# Patient Record
Sex: Male | Born: 1980 | Race: White | Hispanic: No | Marital: Single | Smoking: Current every day smoker
Health system: Southern US, Academic
[De-identification: ages and names within clinical notes are randomized; demographics above are authoritative.]

## PROBLEM LIST (undated history)

## (undated) DIAGNOSIS — I10 Essential (primary) hypertension: Secondary | ICD-10-CM

## (undated) DIAGNOSIS — M1990 Unspecified osteoarthritis, unspecified site: Secondary | ICD-10-CM

## (undated) DIAGNOSIS — J449 Chronic obstructive pulmonary disease, unspecified: Secondary | ICD-10-CM

## (undated) DIAGNOSIS — E7800 Pure hypercholesterolemia, unspecified: Secondary | ICD-10-CM

## (undated) DIAGNOSIS — M109 Gout, unspecified: Secondary | ICD-10-CM

## (undated) HISTORY — DX: Unspecified osteoarthritis, unspecified site: M19.90

## (undated) HISTORY — DX: Chronic obstructive pulmonary disease, unspecified (CMS HCC): J44.9

## (undated) HISTORY — DX: Gout, unspecified: M10.9

## (undated) HISTORY — DX: Pure hypercholesterolemia, unspecified: E78.00

## (undated) HISTORY — PX: HX NO SURGICAL PROCEDURES: 2100001501

## (undated) HISTORY — DX: Essential (primary) hypertension: I10

---

## 2021-04-15 ENCOUNTER — Emergency Department (HOSPITAL_COMMUNITY): Payer: Self-pay | Admitting: Emergency Medicine

## 2022-07-02 ENCOUNTER — Other Ambulatory Visit (HOSPITAL_BASED_OUTPATIENT_CLINIC_OR_DEPARTMENT_OTHER): Payer: BLUE CROSS/BLUE SHIELD

## 2022-07-02 ENCOUNTER — Other Ambulatory Visit (HOSPITAL_COMMUNITY): Payer: Self-pay | Admitting: PSYCHIATRY AND NEUROLOGY-NEUROLOGY

## 2022-07-02 ENCOUNTER — Other Ambulatory Visit: Payer: Self-pay

## 2022-07-02 ENCOUNTER — Inpatient Hospital Stay
Admission: RE | Admit: 2022-07-02 | Discharge: 2022-07-02 | Disposition: A | Discharge status: Home / Self Care | Payer: BLUE CROSS/BLUE SHIELD | Source: Ambulatory Visit | Attending: PSYCHIATRY AND NEUROLOGY-NEUROLOGY | Admitting: PSYCHIATRY AND NEUROLOGY-NEUROLOGY

## 2022-07-02 DIAGNOSIS — M25561 Pain in right knee: Secondary | ICD-10-CM | POA: Insufficient documentation

## 2022-07-03 LAB — URIC ACID: URIC ACID: 5.9 mg/dL (ref 2.3–7.6)

## 2022-08-18 ENCOUNTER — Inpatient Hospital Stay
Admission: RE | Admit: 2022-08-18 | Discharge: 2022-08-18 | Disposition: A | Discharge status: Home / Self Care | Payer: BLUE CROSS/BLUE SHIELD | Source: Ambulatory Visit | Attending: NURSE PRACTITIONER | Admitting: NURSE PRACTITIONER

## 2022-08-18 ENCOUNTER — Other Ambulatory Visit (HOSPITAL_COMMUNITY): Payer: Self-pay | Admitting: NURSE PRACTITIONER

## 2022-08-18 ENCOUNTER — Other Ambulatory Visit: Payer: Self-pay

## 2022-08-18 DIAGNOSIS — F17290 Nicotine dependence, other tobacco product, uncomplicated: Secondary | ICD-10-CM

## 2022-08-18 DIAGNOSIS — M25561 Pain in right knee: Secondary | ICD-10-CM

## 2022-08-18 DIAGNOSIS — M25562 Pain in left knee: Secondary | ICD-10-CM | POA: Insufficient documentation

## 2022-08-31 ENCOUNTER — Encounter (HOSPITAL_COMMUNITY): Payer: Self-pay

## 2022-08-31 ENCOUNTER — Ambulatory Visit (HOSPITAL_COMMUNITY): Payer: Self-pay

## 2022-08-31 ENCOUNTER — Other Ambulatory Visit (HOSPITAL_COMMUNITY): Payer: Self-pay | Admitting: PULMONARY DISEASE

## 2022-08-31 DIAGNOSIS — R0602 Shortness of breath: Secondary | ICD-10-CM

## 2022-09-08 ENCOUNTER — Other Ambulatory Visit: Payer: Self-pay

## 2022-09-08 ENCOUNTER — Inpatient Hospital Stay
Admission: RE | Admit: 2022-09-08 | Discharge: 2022-09-08 | Disposition: A | Discharge status: Home / Self Care | Payer: BLUE CROSS/BLUE SHIELD | Source: Ambulatory Visit | Attending: PULMONARY DISEASE | Admitting: PULMONARY DISEASE

## 2022-09-08 DIAGNOSIS — R0602 Shortness of breath: Secondary | ICD-10-CM | POA: Insufficient documentation

## 2022-09-08 MED ORDER — SODIUM CHLORIDE 0.9 % INJECTION SOLUTION
2.0000 mL | Freq: Once | INTRAVENOUS | Status: DC | PRN
Start: 2022-09-08 — End: 2022-09-09
  Administered 2022-09-08: 5 mL via INTRAVENOUS

## 2022-09-11 DIAGNOSIS — R0602 Shortness of breath: Secondary | ICD-10-CM

## 2022-10-29 ENCOUNTER — Inpatient Hospital Stay
Admission: RE | Admit: 2022-10-29 | Discharge: 2022-10-29 | Disposition: A | Discharge status: Home / Self Care | Payer: Medicaid Other | Source: Ambulatory Visit | Attending: NURSE PRACTITIONER | Admitting: NURSE PRACTITIONER

## 2022-10-29 ENCOUNTER — Other Ambulatory Visit (HOSPITAL_COMMUNITY): Payer: Self-pay | Admitting: NURSE PRACTITIONER

## 2022-10-29 ENCOUNTER — Inpatient Hospital Stay (HOSPITAL_BASED_OUTPATIENT_CLINIC_OR_DEPARTMENT_OTHER)
Admission: RE | Admit: 2022-10-29 | Discharge: 2022-10-29 | Disposition: A | Discharge status: Home / Self Care | Payer: Medicaid Other | Source: Ambulatory Visit

## 2022-10-29 ENCOUNTER — Other Ambulatory Visit: Payer: Self-pay

## 2022-10-29 DIAGNOSIS — M545 Low back pain, unspecified: Secondary | ICD-10-CM

## 2023-05-01 ENCOUNTER — Emergency Department (HOSPITAL_BASED_OUTPATIENT_CLINIC_OR_DEPARTMENT_OTHER): Payer: MEDICAID

## 2023-05-01 ENCOUNTER — Encounter (HOSPITAL_BASED_OUTPATIENT_CLINIC_OR_DEPARTMENT_OTHER): Payer: Self-pay

## 2023-05-01 ENCOUNTER — Other Ambulatory Visit: Payer: Self-pay

## 2023-05-01 ENCOUNTER — Emergency Department
Admission: EM | Admit: 2023-05-01 | Discharge: 2023-05-01 | Disposition: A | Discharge status: Home / Self Care | Payer: MEDICAID | Attending: Family Medicine | Admitting: Family Medicine

## 2023-05-01 DIAGNOSIS — W19XXXA Unspecified fall, initial encounter: Secondary | ICD-10-CM | POA: Insufficient documentation

## 2023-05-01 DIAGNOSIS — S82121A Displaced fracture of lateral condyle of right tibia, initial encounter for closed fracture: Secondary | ICD-10-CM

## 2023-05-01 DIAGNOSIS — S82144A Nondisplaced bicondylar fracture of right tibia, initial encounter for closed fracture: Secondary | ICD-10-CM | POA: Insufficient documentation

## 2023-05-01 DIAGNOSIS — R262 Difficulty in walking, not elsewhere classified: Secondary | ICD-10-CM | POA: Insufficient documentation

## 2023-05-01 DIAGNOSIS — W1839XA Other fall on same level, initial encounter: Secondary | ICD-10-CM

## 2023-05-01 DIAGNOSIS — Y9302 Activity, running: Secondary | ICD-10-CM | POA: Insufficient documentation

## 2023-05-01 DIAGNOSIS — X501XXA Overexertion from prolonged static or awkward postures, initial encounter: Secondary | ICD-10-CM | POA: Insufficient documentation

## 2023-05-01 MED ORDER — KETOROLAC 30 MG/ML (1 ML) INJECTION SOLUTION
INTRAMUSCULAR | Status: AC
Start: 2023-05-01 — End: 2023-05-01
  Filled 2023-05-01: qty 1

## 2023-05-01 MED ORDER — METHYLPREDNISOLONE SOD SUCC 125 MG SOLUTION FOR INJECTION WRAPPER
INTRAVENOUS | Status: AC
Start: 2023-05-01 — End: 2023-05-01
  Filled 2023-05-01: qty 2

## 2023-05-01 MED ORDER — METHYLPREDNISOLONE SOD SUCC 125 MG SOLUTION FOR INJECTION WRAPPER
62.5000 mg | INTRAVENOUS | Status: AC
Start: 2023-05-01 — End: 2023-05-01
  Administered 2023-05-01: 62.5 mg via INTRAMUSCULAR

## 2023-05-01 MED ORDER — HYDROCODONE 5 MG-ACETAMINOPHEN 325 MG TABLET
ORAL_TABLET | ORAL | Status: AC
Start: 2023-05-01 — End: 2023-05-01
  Filled 2023-05-01: qty 1

## 2023-05-01 MED ORDER — KETOROLAC 30 MG/ML (1 ML) INJECTION SOLUTION
30.0000 mg | INTRAMUSCULAR | Status: AC
Start: 2023-05-01 — End: 2023-05-01
  Administered 2023-05-01: 30 mg via INTRAMUSCULAR

## 2023-05-01 MED ORDER — HYDROCODONE 5 MG-ACETAMINOPHEN 325 MG TABLET
1.0000 | ORAL_TABLET | ORAL | Status: AC
Start: 2023-05-01 — End: 2023-05-01
  Administered 2023-05-01: 1 via ORAL

## 2023-05-01 MED ORDER — HYDROCODONE 5 MG-ACETAMINOPHEN 325 MG TABLET
1.0000 | ORAL_TABLET | Freq: Four times a day (QID) | ORAL | 0 refills | Status: DC | PRN
Start: 2023-05-01 — End: 2023-07-28
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
## 2023-05-01 NOTE — ED Nurses Note (Signed)
Patient given written and verbal d/c instructions. All questions/concerns addressed. Informed to return with any concerns. Informed to follow up with orthopedic as soon as possible. Crutches and crutch training demonstrated. Patient leaving via wheelchair at this time.

## 2023-05-01 NOTE — ED Nurses Note (Signed)
Patient continues to report pain to right knee. Awaiting CT.

## 2023-05-01 NOTE — ED Triage Notes (Signed)
Twisted rt knee getting away from a bee yesteday, now pain with movement, hard to bear weight.
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
## 2023-05-24 IMAGING — MR MRI KNEE RT W/O CONTRAST
5 series · 40 of 40 positions shown · IV contrast (gadolinium)
Comparison: None available.

﻿EXAM:  [id number]   MRI KNEE RT W/O CONTRAST
INDICATION: 41-year-old with history of possible lateral tibial plateau fracture.  Trauma due to fall and running 3 weeks ago.  No prior knee surgery.
TECHNIQUE: Multiplanar, multisequential MRI of the right knee was performed without gadolinium contrast.

[Series 5: PD fat-sat · axial · right · 4.0mm · 0.53mm/px · z∈[-55,+76]mm · 8 of 30 slices shown (1 of 3)]
[im 1/30]
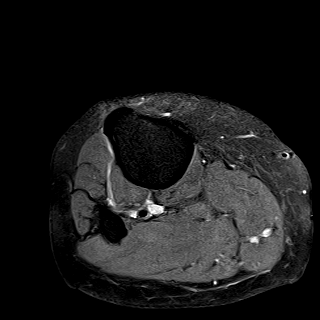
[im 5/30]
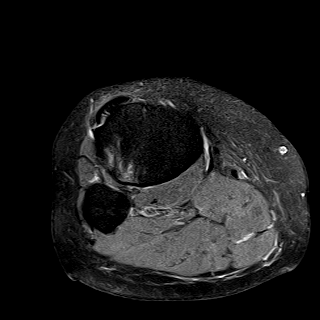
[im 9/30]
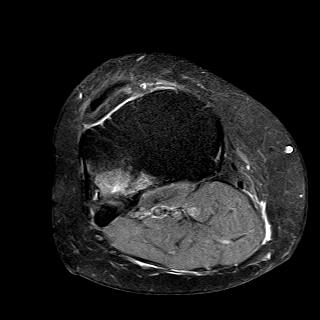
[im 13/30]
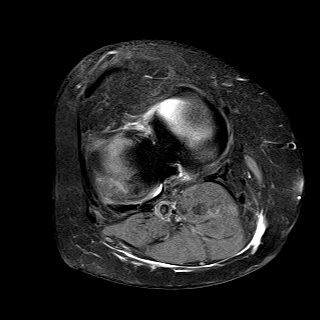
[im 17/30]
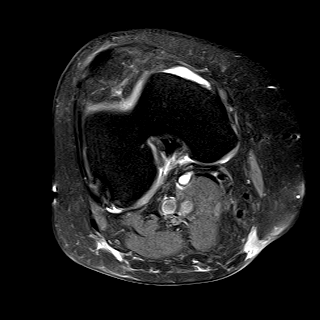
[im 21/30]
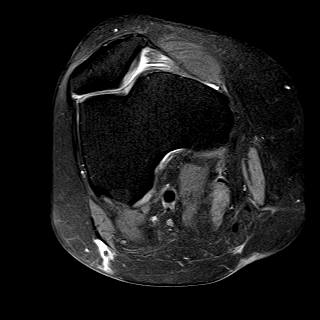
[im 25/30]
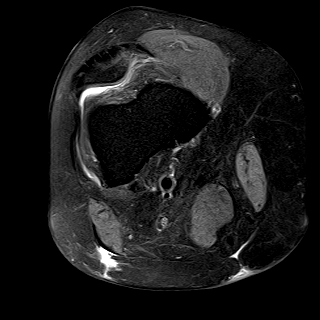
[im 30/30]
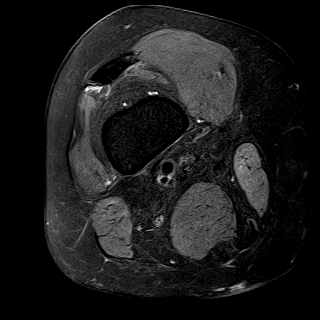

[Series 6: PD fat-sat · sagittal · right · 3.0mm · 0.53mm/px · 9 of 30 slices shown (2 of 3)]
[im 1/30]
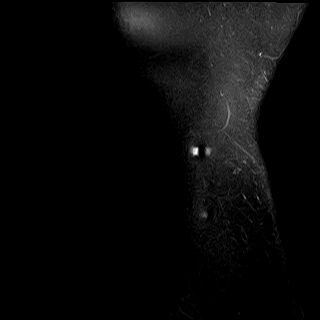
[im 4/30]
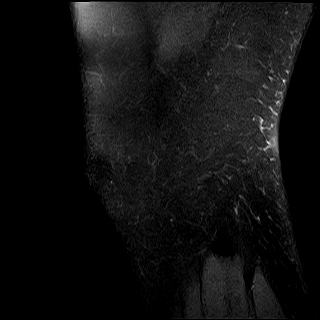
[im 8/30]
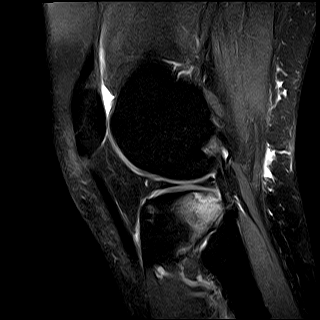
[im 11/30]
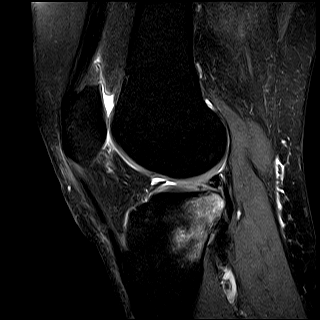
[im 15/30]
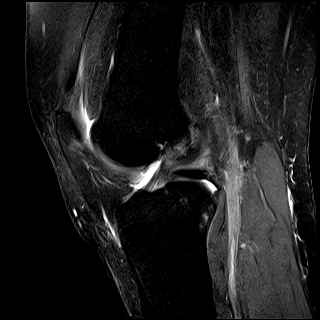
[im 19/30]
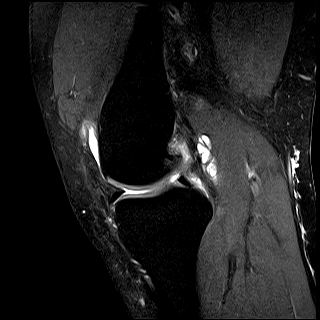
[im 22/30]
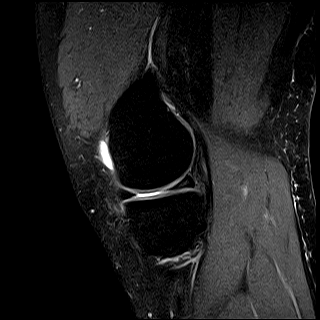
[im 26/30]
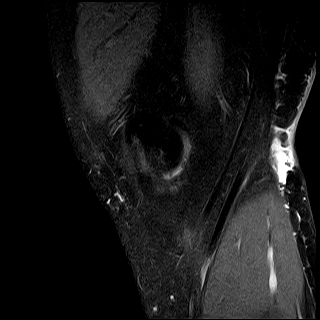
[im 30/30]
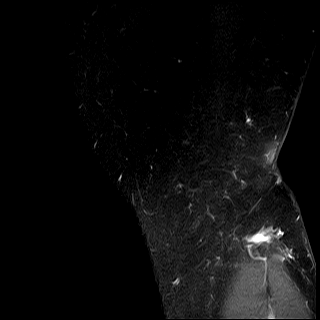

[Series 7: T1 · sagittal · right · 3.0mm · 0.44mm/px · 9 of 30 slices shown]
[im 1/30]
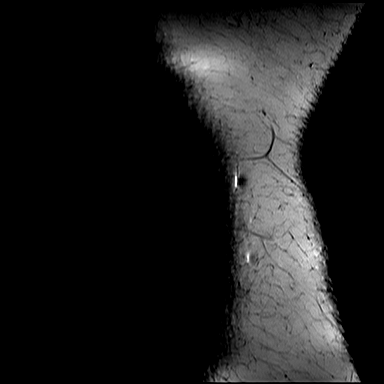
[im 4/30]
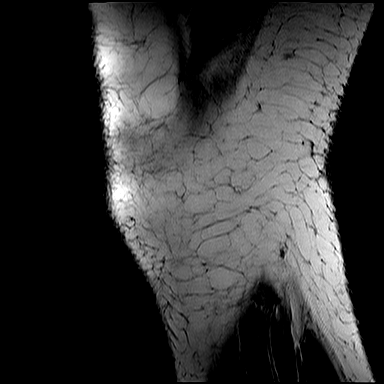
[im 8/30]
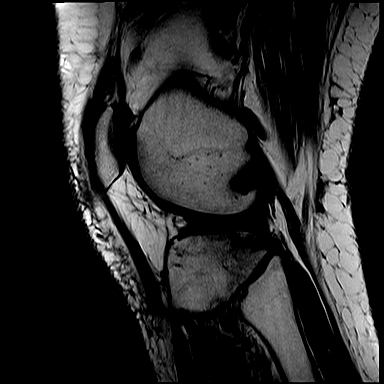
[im 11/30]
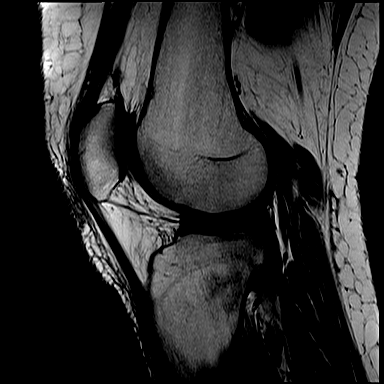
[im 15/30]
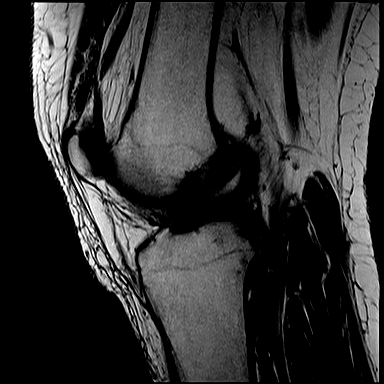
[im 19/30]
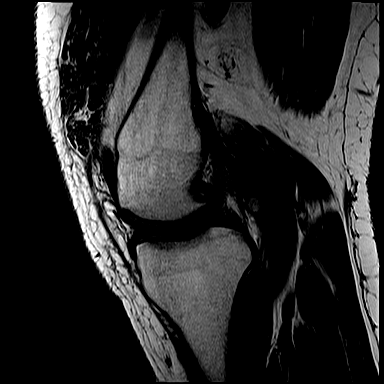
[im 22/30]
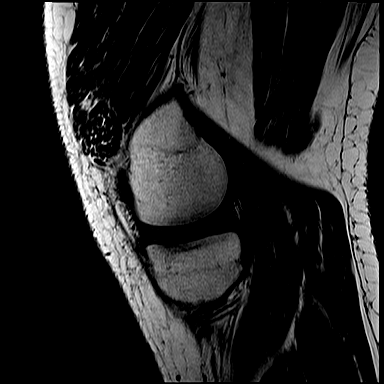
[im 26/30]
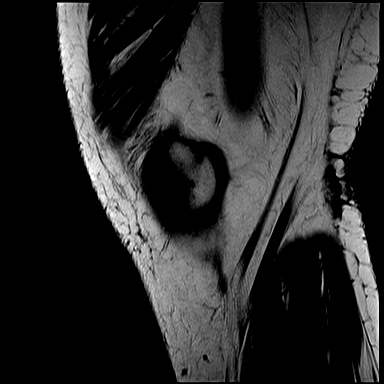
[im 30/30]
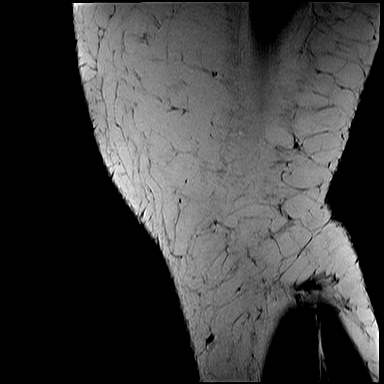

[Series 8: STIR · coronal · right · 3.5mm · 0.53mm/px · 7 of 25 slices shown]
[im 1/25]
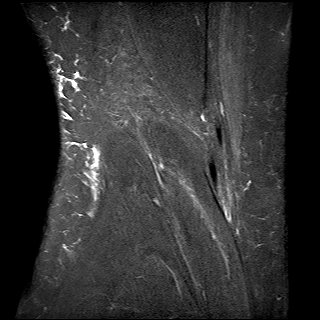
[im 5/25]
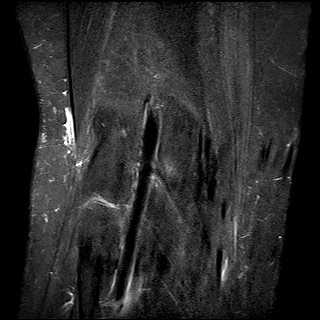
[im 9/25]
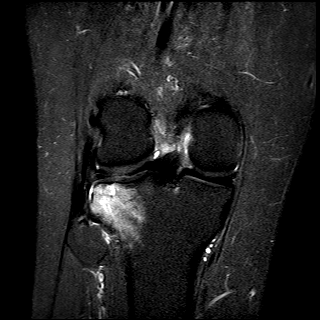
[im 13/25]
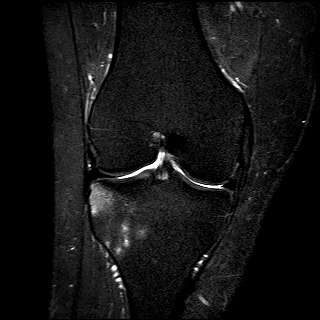
[im 17/25]
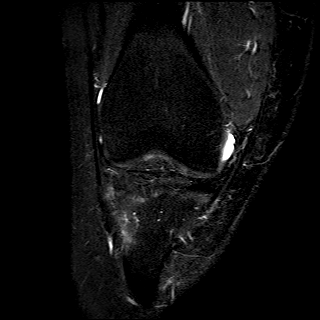
[im 21/25]
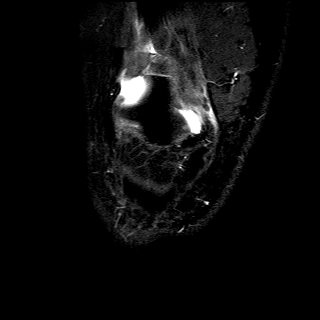
[im 25/25]
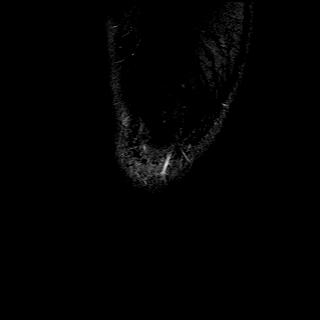

[Series 9: PD fat-sat · coronal · right · 3.5mm · 0.53mm/px · 7 of 25 slices shown (3 of 3)]
[im 1/25]
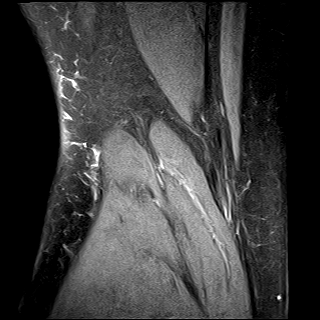
[im 5/25]
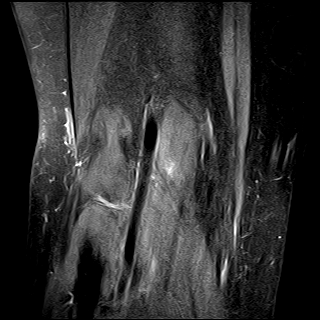
[im 9/25]
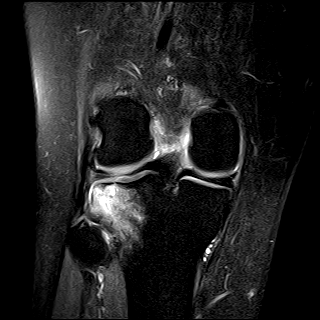
[im 13/25]
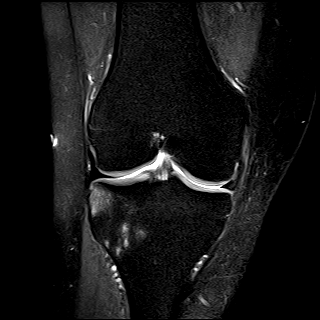
[im 17/25]
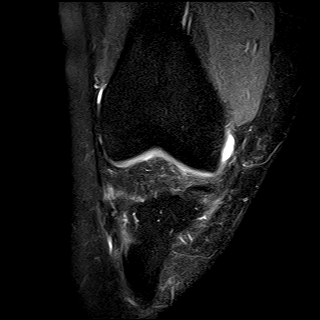
[im 21/25]
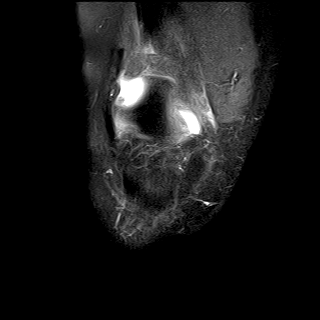
[im 25/25]
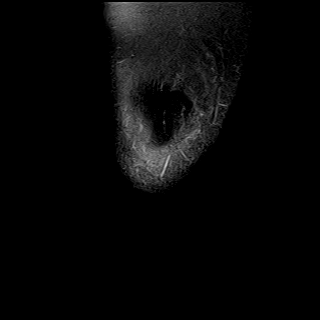

[40 of 40 positions shown; findings below may reference images not displayed]

FINDINGS: Acute, nondisplaced fracture of the posterior corner of lateral tibial plateau is noted with surrounding bone marrow edema.  There is no disruption of the overlying articular cartilage.

The lateral meniscus shows horizontally oriented, nondisplaced tear of the mid and posterior horn.

The anterior and posterior cruciate ligaments are intact.  Medial meniscus shows no acute findings.  The collateral ligaments are intact.  Quadriceps tendon and patellar tendon are intact.  Small effusion in the knee joint.
IMPRESSION: 1. Acute, nondisplaced fracture of the posterior corner of lateral tibial plateau with surrounding bone marrow edema. Articular cartilage over the fracture area is intact.

2. Nondisplaced, horizontally oriented tear of the mid and posterior horn of the lateral meniscus.

## 2023-06-30 ENCOUNTER — Inpatient Hospital Stay (HOSPITAL_BASED_OUTPATIENT_CLINIC_OR_DEPARTMENT_OTHER)
Admission: RE | Admit: 2023-06-30 | Discharge: 2023-06-30 | Disposition: A | Discharge status: Home / Self Care | Payer: MEDICAID | Source: Ambulatory Visit | Attending: Orthopaedic Surgery

## 2023-06-30 ENCOUNTER — Other Ambulatory Visit: Payer: Self-pay

## 2023-06-30 ENCOUNTER — Other Ambulatory Visit: Payer: MEDICAID | Attending: Orthopaedic Surgery

## 2023-06-30 ENCOUNTER — Other Ambulatory Visit (HOSPITAL_COMMUNITY): Payer: Self-pay | Admitting: Orthopaedic Surgery

## 2023-06-30 DIAGNOSIS — Z01818 Encounter for other preprocedural examination: Secondary | ICD-10-CM | POA: Insufficient documentation

## 2023-06-30 DIAGNOSIS — R001 Bradycardia, unspecified: Secondary | ICD-10-CM

## 2023-06-30 LAB — ECG 12 LEAD
Atrial Rate: 57 {beats}/min
Calculated P Axis: 28 degrees
Calculated R Axis: 35 degrees
Calculated T Axis: 4 degrees
PR Interval: 140 ms
QRS Duration: 92 ms
QT Interval: 396 ms
QTC Calculation: 385 ms
Ventricular rate: 57 {beats}/min

## 2023-06-30 LAB — URINALYSIS, MACROSCOPIC
BILIRUBIN: NEGATIVE mg/dL
BLOOD: NEGATIVE mg/dL
GLUCOSE: NEGATIVE mg/dL
KETONES: NEGATIVE mg/dL
LEUKOCYTES: NEGATIVE WBCs/uL
NITRITE: NEGATIVE
PH: 7 (ref 5.0–9.0)
PROTEIN: NEGATIVE mg/dL
SPECIFIC GRAVITY: 1.014 (ref 1.002–1.030)
UROBILINOGEN: NORMAL mg/dL

## 2023-06-30 LAB — CBC
HCT: 43.1 % (ref 36.7–47.1)
HGB: 14.7 g/dL (ref 12.5–16.3)
MCH: 28.6 pg (ref 23.8–33.4)
MCHC: 34.1 g/dL (ref 32.5–36.3)
MCV: 83.9 fL (ref 73.0–96.2)
MPV: 8 fL (ref 7.4–11.4)
PLATELETS: 338 10*3/uL (ref 140–440)
RBC: 5.14 10*6/uL (ref 4.06–5.63)
RDW: 15 % (ref 12.1–16.2)
WBC: 5.9 10*3/uL (ref 3.6–10.2)

## 2023-06-30 LAB — BASIC METABOLIC PANEL
ANION GAP: 7 mmol/L (ref 4–13)
BUN/CREA RATIO: 13 (ref 6–22)
BUN: 14 mg/dL (ref 7–25)
CALCIUM: 9.6 mg/dL (ref 8.6–10.3)
CHLORIDE: 108 mmol/L — ABNORMAL HIGH (ref 98–107)
CO2 TOTAL: 25 mmol/L (ref 21–31)
CREATININE: 1.08 mg/dL (ref 0.60–1.30)
ESTIMATED GFR: 88 mL/min/{1.73_m2} (ref >59)
GLUCOSE: 103 mg/dL (ref 74–109)
OSMOLALITY, CALCULATED: 280 mOsm/kg (ref 270–290)
POTASSIUM: 4.1 mmol/L (ref 3.5–5.1)
SODIUM: 140 mmol/L (ref 136–145)

## 2023-06-30 LAB — URINALYSIS, MICROSCOPIC
RBCS: <1 /hpf (ref <4)
WBCS: <1 /hpf (ref <6)

## 2023-07-28 ENCOUNTER — Other Ambulatory Visit: Payer: Self-pay

## 2023-07-28 ENCOUNTER — Inpatient Hospital Stay
Admission: RE | Admit: 2023-07-28 | Discharge: 2023-07-28 | Disposition: A | Discharge status: Home / Self Care | Payer: MEDICAID | Source: Ambulatory Visit | Attending: Orthopaedic Surgery | Admitting: Orthopaedic Surgery

## 2023-07-28 ENCOUNTER — Ambulatory Visit (HOSPITAL_COMMUNITY): Payer: MEDICAID | Admitting: Anesthesiology

## 2023-07-28 ENCOUNTER — Encounter (HOSPITAL_COMMUNITY): Payer: MEDICAID | Admitting: Orthopaedic Surgery

## 2023-07-28 ENCOUNTER — Encounter (HOSPITAL_COMMUNITY): Payer: Self-pay | Admitting: Orthopaedic Surgery

## 2023-07-28 ENCOUNTER — Encounter (HOSPITAL_COMMUNITY)
Admission: RE | Disposition: A | Discharge status: Home / Self Care | Payer: Self-pay | Source: Ambulatory Visit | Attending: Orthopaedic Surgery

## 2023-07-28 DIAGNOSIS — S83241A Other tear of medial meniscus, current injury, right knee, initial encounter: Secondary | ICD-10-CM | POA: Insufficient documentation

## 2023-07-28 DIAGNOSIS — Z6841 Body Mass Index (BMI) 40.0 and over, adult: Secondary | ICD-10-CM | POA: Insufficient documentation

## 2023-07-28 DIAGNOSIS — E785 Hyperlipidemia, unspecified: Secondary | ICD-10-CM | POA: Insufficient documentation

## 2023-07-28 DIAGNOSIS — I10 Essential (primary) hypertension: Secondary | ICD-10-CM | POA: Insufficient documentation

## 2023-07-28 DIAGNOSIS — K219 Gastro-esophageal reflux disease without esophagitis: Secondary | ICD-10-CM | POA: Insufficient documentation

## 2023-07-28 DIAGNOSIS — J449 Chronic obstructive pulmonary disease, unspecified: Secondary | ICD-10-CM | POA: Insufficient documentation

## 2023-07-28 DIAGNOSIS — E669 Obesity, unspecified: Secondary | ICD-10-CM | POA: Insufficient documentation

## 2023-07-28 DIAGNOSIS — M109 Gout, unspecified: Secondary | ICD-10-CM | POA: Insufficient documentation

## 2023-07-28 DIAGNOSIS — Z720 Tobacco use: Secondary | ICD-10-CM | POA: Insufficient documentation

## 2023-07-28 DIAGNOSIS — R569 Unspecified convulsions: Secondary | ICD-10-CM | POA: Insufficient documentation

## 2023-07-28 DIAGNOSIS — G4730 Sleep apnea, unspecified: Secondary | ICD-10-CM | POA: Insufficient documentation

## 2023-07-28 DIAGNOSIS — S83281A Other tear of lateral meniscus, current injury, right knee, initial encounter: Secondary | ICD-10-CM | POA: Insufficient documentation

## 2023-07-28 SURGERY — ARTHROSCOPY KNEE WITH MENISCAL REPAIR
Anesthesia: General | Site: Knee | Laterality: Right | Wound class: Clean Wound: Uninfected operative wounds in which no inflammation occurred

## 2023-07-28 MED ORDER — SODIUM CHLORIDE 0.9 % (FLUSH) INJECTION SYRINGE
3.0000 mL | INJECTION | INTRAMUSCULAR | Status: DC | PRN
Start: 2023-07-28 — End: 2023-07-28

## 2023-07-28 MED ORDER — MORPHINE 10 MG/ML INJECTION WRAPPER
INTRAVENOUS | Status: AC
Start: 2023-07-28 — End: 2023-07-28
  Filled 2023-07-28: qty 1

## 2023-07-28 MED ORDER — ONDANSETRON HCL (PF) 4 MG/2 ML INJECTION SOLUTION
4.0000 mg | Freq: Once | INTRAMUSCULAR | Status: DC | PRN
Start: 2023-07-28 — End: 2023-07-28

## 2023-07-28 MED ORDER — SODIUM CHLORIDE 0.9 % (FLUSH) INJECTION SYRINGE
3.0000 mL | INJECTION | Freq: Three times a day (TID) | INTRAMUSCULAR | Status: DC
Start: 2023-07-28 — End: 2023-07-28

## 2023-07-28 MED ORDER — DEXAMETHASONE SODIUM PHOSPHATE 4 MG/ML INJECTION SOLUTION
INTRAMUSCULAR | Status: AC
Start: 2023-07-28 — End: 2023-07-28
  Filled 2023-07-28: qty 1

## 2023-07-28 MED ORDER — PROCHLORPERAZINE EDISYLATE 10 MG/2 ML (5 MG/ML) INJECTION SOLUTION
5.0000 mg | Freq: Once | INTRAMUSCULAR | Status: DC | PRN
Start: 2023-07-28 — End: 2023-07-28

## 2023-07-28 MED ORDER — ONDANSETRON HCL (PF) 4 MG/2 ML INJECTION SOLUTION
INTRAMUSCULAR | Status: AC
Start: 2023-07-28 — End: 2023-07-28
  Filled 2023-07-28: qty 2

## 2023-07-28 MED ORDER — HYDROCODONE 7.5 MG-ACETAMINOPHEN 325 MG TABLET
1.0000 | ORAL_TABLET | ORAL | 0 refills | Status: AC | PRN
Start: 2023-07-28 — End: ?

## 2023-07-28 MED ORDER — SUGAMMADEX 100 MG/ML INTRAVENOUS SOLUTION
Freq: Once | INTRAVENOUS | Status: DC | PRN
Start: 2023-07-28 — End: 2023-07-28
  Administered 2023-07-28: 500 mg via INTRAVENOUS

## 2023-07-28 MED ORDER — FENTANYL (PF) 50 MCG/ML INJECTION SOLUTION
INTRAMUSCULAR | Status: AC
Start: 2023-07-28 — End: 2023-07-28
  Filled 2023-07-28: qty 2

## 2023-07-28 MED ORDER — ROPIVACAINE (PF) 2 MG/ML (0.2 %) INJECTION SOLUTION
INTRAMUSCULAR | Status: AC
Start: 2023-07-28 — End: 2023-07-28
  Filled 2023-07-28: qty 10

## 2023-07-28 MED ORDER — PROPOFOL 10 MG/ML IV BOLUS
INJECTION | Freq: Once | INTRAVENOUS | Status: DC | PRN
Start: 2023-07-28 — End: 2023-07-28
  Administered 2023-07-28: 200 mg via INTRAVENOUS

## 2023-07-28 MED ORDER — IPRATROPIUM 0.5 MG-ALBUTEROL 3 MG (2.5 MG BASE)/3 ML NEBULIZATION SOLN
3.0000 mL | INHALATION_SOLUTION | Freq: Once | RESPIRATORY_TRACT | Status: DC | PRN
Start: 2023-07-28 — End: 2023-07-28

## 2023-07-28 MED ORDER — LACTATED RINGERS INTRAVENOUS SOLUTION
INTRAVENOUS | Status: DC | PRN
Start: 2023-07-28 — End: 2023-07-28

## 2023-07-28 MED ORDER — LIDOCAINE (PF) 100 MG/5 ML (2 %) INTRAVENOUS SYRINGE
INJECTION | Freq: Once | INTRAVENOUS | Status: DC | PRN
Start: 2023-07-28 — End: 2023-07-28
  Administered 2023-07-28: 100 mg via INTRAVENOUS

## 2023-07-28 MED ORDER — ONDANSETRON HCL (PF) 4 MG/2 ML INJECTION SOLUTION
4.0000 mg | Freq: Once | INTRAMUSCULAR | Status: AC
Start: 2023-07-28 — End: 2023-07-28
  Administered 2023-07-28: 4 mg via INTRAVENOUS

## 2023-07-28 MED ORDER — LACTATED RINGERS INTRAVENOUS SOLUTION
INTRAVENOUS | Status: DC
Start: 2023-07-28 — End: 2023-07-28

## 2023-07-28 MED ORDER — SUCCINYLCHOLINE 20 MG/ML INTRAVENOUS WRAPPER
INJECTION | Freq: Once | INTRAVENOUS | Status: DC | PRN
Start: 2023-07-28 — End: 2023-07-28
  Administered 2023-07-28: 140 mg via INTRAVENOUS

## 2023-07-28 MED ORDER — DEXAMETHASONE SODIUM PHOSPHATE 4 MG/ML INJECTION SOLUTION
4.0000 mg | Freq: Once | INTRAMUSCULAR | Status: AC
Start: 2023-07-28 — End: 2023-07-28
  Administered 2023-07-28: 4 mg via INTRAVENOUS

## 2023-07-28 MED ORDER — MIDAZOLAM 5 MG/ML INJECTION WRAPPER
2.0000 mg | Freq: Once | INTRAMUSCULAR | Status: DC | PRN
Start: 2023-07-28 — End: 2023-07-28

## 2023-07-28 MED ORDER — ROPIVACAINE (PF) 2 MG/ML (0.2 %) INJECTION SOLUTION
Freq: Once | INTRAVENOUS | Status: DC | PRN
Start: 2023-07-28 — End: 2023-07-28

## 2023-07-28 MED ORDER — FENTANYL (PF) 50 MCG/ML INJECTION WRAPPER
INJECTION | Freq: Once | INTRAMUSCULAR | Status: DC | PRN
Start: 2023-07-28 — End: 2023-07-28
  Administered 2023-07-28: 100 ug via INTRAVENOUS

## 2023-07-28 MED ORDER — ALBUTEROL SULFATE 2.5 MG/3 ML (0.083 %) SOLUTION FOR NEBULIZATION
2.5000 mg | INHALATION_SOLUTION | Freq: Once | RESPIRATORY_TRACT | Status: DC | PRN
Start: 2023-07-28 — End: 2023-07-28

## 2023-07-28 MED ORDER — ROCURONIUM 10 MG/ML INTRAVENOUS SOLUTION
Freq: Once | INTRAVENOUS | Status: DC | PRN
Start: 2023-07-28 — End: 2023-07-28
  Administered 2023-07-28: 5 mg via INTRAVENOUS
  Administered 2023-07-28: 25 mg via INTRAVENOUS

## 2023-07-28 MED ORDER — FAMOTIDINE (PF) 20 MG/2 ML INTRAVENOUS SOLUTION
INTRAVENOUS | Status: AC
Start: 2023-07-28 — End: 2023-07-28
  Filled 2023-07-28: qty 2

## 2023-07-28 MED ORDER — FAMOTIDINE (PF) 20 MG/2 ML INTRAVENOUS SOLUTION
20.0000 mg | Freq: Once | INTRAVENOUS | Status: AC
Start: 2023-07-28 — End: 2023-07-28
  Administered 2023-07-28: 20 mg via INTRAVENOUS

## 2023-07-28 MED ORDER — FENTANYL (PF) 50 MCG/ML INJECTION WRAPPER
50.0000 ug | INJECTION | INTRAMUSCULAR | Status: DC | PRN
Start: 2023-07-28 — End: 2023-07-28
  Administered 2023-07-28 (×2): 50 ug via INTRAVENOUS

## 2023-07-28 MED ORDER — HYDROMORPHONE 2 MG/ML INJECTION WRAPPER
1.0000 mg | INJECTION | Freq: Once | INTRAMUSCULAR | Status: DC | PRN
Start: 2023-07-28 — End: 2023-07-28

## 2023-07-28 MED ORDER — OXYCODONE-ACETAMINOPHEN 5 MG-325 MG TABLET
1.0000 | ORAL_TABLET | Freq: Once | ORAL | Status: DC | PRN
Start: 2023-07-28 — End: 2023-07-28
  Administered 2023-07-28: 1 via ORAL
  Filled 2023-07-28: qty 1

## 2023-07-28 SURGICAL SUPPLY — 31 items
BANDAGE ELT 5.8YDX4IN NONST SLFCLS ELAS KNIT TEAL END STCH VELCRO COTTON POLY BLND STD LGTH COMPRESS (WOUND CARE SUPPLY) ×1 IMPLANT
BANDAGE ESMARK 9FTX6IN STRL SYN COMPRESS LF (WOUND CARE SUPPLY) ×1 IMPLANT
BLADE 11 2 END CBNSTL SURG STRL DISP (SURGICAL CUTTING SUPPLIES) ×1 IMPLANT
BLADE SHAVER 13CM 4MM COOLCUT 2 CUT POWER SFT TISS RESCT STRL DISP (ENDOSCOPIC SUPPLIES) ×1 IMPLANT
CONV USE ITEM 337687 - DRAPE REINF FNFLD 90X44IN LF  STRL DISP SURG (DRAPE/PACKS/SHEETS/OR TOWEL) ×1
COUNTER 20 CNT BLOCK ADH NEEDLE STRL LF  RD SHARP FOAM 15.75X11.5X14IN DISP (MED SURG SUPPLIES) ×1 IMPLANT
CUFF TOURNIQUET PURP 34X4IN COLOR CUF CYL 2 PORT 1 BLADDER QC 40IN STRL LF  DISP (MED SURG SUPPLIES) ×1 IMPLANT
DETERGENT INSTR 22OZ TRNSPT GEL RINSE FREE NEUT PH PREKLENZ CLR PLSNT LF (MISCELLANEOUS PT CARE ITEMS) ×1 IMPLANT
DRAPE INCS ANTIMIC 23X23IN IOBN2 TRNSPR (DRAPE/PACKS/SHEETS/OR TOWEL) ×1 IMPLANT
DRAPE REINF FNFLD 90X44IN LF  STRL DISP SURG (DRAPE/PACKS/SHEETS/OR TOWEL) ×1 IMPLANT
GLOVE SURG 6.5 LF  PF BEAD CUF STRL CRM 11.3IN PROTEXIS PI PLISPRN THK9.1 MIL (GLOVES AND ACCESSORIES) ×2 IMPLANT
GLOVE SURG 7 LF  PF SMOOTH BEAD CUF INTLK STRL BLU 11.8IN PROTEXIS NEU-THERA PLISPRN THK7.9 MIL (GLOVES AND ACCESSORIES) ×1 IMPLANT
GLOVE SURG 7.5 LTX PF SMOOTH BEAD CUF STRL YW 12IN PROTEXIS (GLOVES AND ACCESSORIES) ×1 IMPLANT
GLOVE SURG 8 LTX PF SMOOTH BEAD CUF STRL YW 12IN PROTEXIS NEU-THERA DDRGL THK8.7 MIL (GLOVES AND ACCESSORIES) ×1 IMPLANT
GOWN SURG LRG STD LGTH L3 HKLP CLSR RGLN SLEEVE TWL STRL LF  DISP GRN AERO BLU PRFRM FBRC (DRAPE/PACKS/SHEETS/OR TOWEL) ×1 IMPLANT
GOWN SURGICAL SIRUS SMS LG XLONG 52IN LEV 4 STRL LF DISP BLUE (GLOVES AND ACCESSORIES) ×1 IMPLANT
LABEL MED CORRECT MED LABELING SYS 4 FLG 2 SHEET 24 PRPRNT STRL (MED SURG SUPPLIES) ×1 IMPLANT
MAT INSTR TRY 44X36IN WTPRF BACKSHEET TPNX BLU (MISCELLANEOUS PT CARE ITEMS) ×1 IMPLANT
NEEDLE HYPO  18GA 1.5IN REG WL BD PRCSNGL POLYPROP REG BVL LL HUB CLR CD DEHP-FR STRL LF  DISP (MED SURG SUPPLIES) ×1 IMPLANT
NEEDLE SPINAL PNK 3.5IN 18GA QUINCKE REG WL POLYPROP QUINCKE TIP STRL LF  DISP (MED SURG SUPPLIES) ×1 IMPLANT
PUMP TUBING 13FT CONT WV III DUALWAVE ARTHRO STRL DISP (MED SURG SUPPLIES) ×1 IMPLANT
SOL IRRG LR 3L PRSV N-PYRG FLXB CONTAINR STRL LF (MEDICATIONS/SOLUTIONS) ×1 IMPLANT
SOL SURG PREP 26ML DRPRP 74% ISPRP 0.7% IOD POVACRYLEX SLF CNTN APPL SKIN STRL PREOP (MED SURG SUPPLIES) ×1 IMPLANT
SPONGE GAUZE 4X4IN MDCHC COTTON 12 PLY TY 7 LF  STRL DISP (WOUND CARE SUPPLY) ×1 IMPLANT
SUTURE 4-0 FS1 PROLENE 18IN BLU MONOF NONAB (SUTURE/WOUND CLOSURE) ×1 IMPLANT
SYRINGE LL 10ML LF  STRL GRAD N-PYRG DEHP-FR PVC FREE MED DISP (MED SURG SUPPLIES) ×1 IMPLANT
TOWEL 24X16IN COTTON BLU DISP SURG STRL LF (DRAPE/PACKS/SHEETS/OR TOWEL) ×1 IMPLANT
TRAY ~~LOC~~ ARTHROSCOPY ~~LOC~~ - ~~LOC~~ COMMUNITY HOSP (CUSTOM TRAYS & PACK) ×1 IMPLANT
TUBING IRRG 6FT DUALWAVE BKFL CK VALVE EXT STRL DISP (ENDOSCOPIC SUPPLIES) ×1 IMPLANT
TUBING SUCT CLR 12FT .25IN ARGYLE PVC NCDTV STR MALE FEMALE MLD CONN STRL LF (MED SURG SUPPLIES) ×1 IMPLANT
TUBING SUCT CLR 6FT .25IN ARGYLE PVC NCDTV STR MALE FEMALE MLD CONN STRL LF (MED SURG SUPPLIES) ×1 IMPLANT

## 2023-07-28 NOTE — OR PreOp (Signed)
Iv attempt x 2 unsuccessful

## 2023-07-28 NOTE — OR Surgeon (Signed)
[facility]   Operative Note   PATIENT NAME:  Nathan Burke, Nathan Burke  MRN:  [id number]  DOB:  September 12, 1981    Date of Procedure:  07/28/2023  Preoperative Diagnosis: LATERAL MENISCUS TEAR RIGHT KNEE   Postoperative Diagnoses:  MEDIAL AND LATERAL MENISCUS TEAR RIGHT KNEE   Procedure Performed: Procedure(s) (LRB):  DIAGNOSTIC ARTHROSCOPY WITH PARTIAL MEDIAL AND LATERAL MENSCECTOMY RIGHT KNEE (Right)    Surgeon: Quenton Fetter, DO   Anesthesia: General  Estimated Blood Loss: Minimal  Complications: None immediate  Description of Procedure patient taken to the operating room given a general anesthetic tourniquet placed over padding on the right thigh leg placed in the knee holder prepped with DuraPrep draped in a sterile manner.  Esmarch exsanguination tourniquet to 350 mmHg medial and lateral portals utilized exam of the knee performed.  Patellofemoral joint without abnormality, medial gutter was clear.  Medial compartment showed a posterior horn tear of the medial meniscus excised with basket forceps and shaver back to a stable edge peripheral probed and stable.  Notch probed and palpated with intact cruciate ligaments lateral compartment probed and palpated with a posterior horn tear of the lateral meniscus excised with basket forceps and shaver back to stable edge peripheral probed and stable.  Lateral gutter was clear.  Knee lavaged all loose fragments removed injected with Naropin portals closed with 5 0 Prolene suture in an interrupted fashion sterile bandage applied tourniquet released and the patient was awoken from anesthesia and brought to recovery in stable condition.  Quenton Fetter, DO   This note was partially generated using MModal Fluency Direct system, and there may be some incorrect words, spellings, and punctuation that were not noted in checking the note before saving.

## 2023-07-28 NOTE — Discharge Instructions (Signed)
Follow up with Lequita Halt on 08/09/23 @ 11:05    May remove dressing in 48-72 hours. Take quick shower then change daily and as needed.     Keep incision site clean and dry until seen by Dr.    Call Dr for any problems.

## 2023-07-28 NOTE — OR Nursing (Signed)
PICTURES TAKEN? YES

## 2023-07-28 NOTE — Anesthesia Preprocedure Evaluation (Signed)
ANESTHESIA PRE-OP EVALUATION  Planned Procedure: DIAGNOSTIC ARTHROSCOPY WITH REPAIR VERSUS EXCISION RIGHT KNEE (Right: Knee)  Review of Systems     anesthesia history negative               Pulmonary   COPD, sleep apnea (symptoms) and current smoker,   Cardiovascular    Hypertension, ECG reviewed and hyperlipidemia , Exercise Tolerance: > or = 4 METS        GI/Hepatic/Renal    GERD        Endo/Other    gout and obesity,      Neuro/Psych/MS    seizures (x1 over 20 years ago)     Cancer                        Physical Assessment      Airway       Mallampati: II                  Dental           (+) poor dentition           Pulmonary    Breath sounds clear to auscultation       Cardiovascular    Rhythm: regular  Rate: Normal       Other findings              Plan  ASA 3     Planned anesthesia type: general                             Anesthesia issues/risks discussed are: Sore Throat, Cardiac Events/MI, PONV, Nerve Injuries, Dental Injuries, Stroke and Aspiration.  Anesthetic plan and risks discussed with patient  signed consent obtained          Patient's NPO status is appropriate for Anesthesia.

## 2023-07-28 NOTE — OR PreOp (Signed)
ATTEMPTED IV START X 2

## 2023-07-28 NOTE — Anesthesia Transfer of Care (Signed)
ANESTHESIA TRANSFER OF CARE   Nathan Burke is a 42 y.o. ,male, Weight: 122 kg (270 lb)   had Procedure(s):  DIAGNOSTIC ARTHROSCOPY WITH PARTIAL MEDIAL AND LATERAL MENSCECTOMY RIGHT KNEE  performed  07/28/23   Primary Service: Quenton Fetter, DO    Past Medical History:   Diagnosis Date    Arthritis     COPD (chronic obstructive pulmonary disease) (CMS HCC)     Gout, unspecified     Gout, unspecified     High blood pressure     High cholesterol       Allergy History as of 07/28/23        No Known Allergies                  I completed my transfer of care / handoff to the receiving personnel during which we discussed:  Access, Airway, All key/critical aspects of case discussed, Analgesia, Antibiotics, Expectation of post procedure, Fluids/Product, Gave opportunity for questions and acknowledgement of understanding, Labs and PMHx      Post Location: PACU                                                             Last OR Temp: Temperature: 36.3 C (97.4 F)  ABG:  POTASSIUM   Date Value Ref Range Status   06/30/2023 4.1 3.5 - 5.1 mmol/L Final     KETONES   Date Value Ref Range Status   06/30/2023 Negative Negative, Trace mg/dL Final     CALCIUM   Date Value Ref Range Status   06/30/2023 9.6 8.6 - 10.3 mg/dL Final     Calculated P Axis   Date Value Ref Range Status   06/30/2023 28 degrees Final     Calculated R Axis   Date Value Ref Range Status   06/30/2023 35 degrees Final     Calculated T Axis   Date Value Ref Range Status   06/30/2023 4 degrees Final     Airway:* No LDAs found *  Blood pressure 135/73, pulse 83, temperature 36.3 C (97.4 F), resp. rate 19, height 1.727 m (5\' 8" ), weight 122 kg (270 lb), SpO2 99%.

## 2023-07-28 NOTE — Anesthesia Postprocedure Evaluation (Signed)
Anesthesia Post Op Evaluation    Patient: [employer]  Procedure(s):  DIAGNOSTIC ARTHROSCOPY WITH PARTIAL MEDIAL AND LATERAL MENSCECTOMY RIGHT KNEE    Last Vitals:Temperature: 36.3 C (97.4 F) (07/28/23 0833)  Heart Rate: 83 (07/28/23 0833)  BP (Non-Invasive): 135/73 (07/28/23 1324)  Respiratory Rate: 19 (07/28/23 0833)  SpO2: 99 % (07/28/23 0833)    No notable events documented.    Patient is sufficiently recovered from the effects of anesthesia to participate in the evaluation and has returned to their pre-procedure level.  Patient location during evaluation: PACU       Patient participation: complete - patient participated  Level of consciousness: awake and alert and responsive to verbal stimuli    Pain management: adequate  Airway patency: patent    Anesthetic complications: no  Cardiovascular status: acceptable  Respiratory status: acceptable  Hydration status: acceptable  Patient post-procedure temperature: Pt Normothermic   PONV Status: Absent

## 2023-07-28 NOTE — Interval H&P Note (Signed)
H & P updated the day of the procedure.  1.  H&P completed within 30 days of surgical procedure and has been reviewed within 24 hours of admission but prior to surgery or a procedure requiring anesthesia services, the patient has been examined, and no change has occured in the patients condition since the H&P was completed.       Change in medications: No        No LMP recorded.      Comments:     2.  Patient continues to be appropriate candidate for planned surgical procedure. YES    Haille Pardi, DO

## 2023-07-28 NOTE — H&P (Signed)
Paper H and P on chart, will be scanned to EMR.
